# Patient Record
Sex: Female | Born: 1990 | Race: White | Hispanic: No | Marital: Married | Smoking: Former smoker
Health system: Southern US, Community
[De-identification: ages and names within clinical notes are randomized; demographics above are authoritative.]

## PROBLEM LIST (undated history)

## (undated) DIAGNOSIS — Z5189 Encounter for other specified aftercare: Secondary | ICD-10-CM

## (undated) HISTORY — PX: NO PAST SURGERIES: SHX2092

---
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
## 2008-01-14 ENCOUNTER — Emergency Department (HOSPITAL_COMMUNITY): Admission: EM | Admit: 2008-01-14 | Discharge: 2008-01-14 | Payer: Self-pay | Admitting: Emergency Medicine

## 2008-02-18 ENCOUNTER — Emergency Department (HOSPITAL_COMMUNITY): Admission: EM | Admit: 2008-02-18 | Discharge: 2008-02-18 | Payer: Self-pay | Admitting: Emergency Medicine

## 2008-07-10 ENCOUNTER — Emergency Department (HOSPITAL_COMMUNITY): Admission: EM | Admit: 2008-07-10 | Discharge: 2008-07-11 | Payer: Self-pay | Admitting: Emergency Medicine

## 2008-12-14 ENCOUNTER — Emergency Department (HOSPITAL_COMMUNITY): Admission: EM | Admit: 2008-12-14 | Discharge: 2008-12-14 | Payer: Self-pay | Admitting: Emergency Medicine

## 2010-09-01 LAB — URINALYSIS, ROUTINE W REFLEX MICROSCOPIC
Glucose, UA: NEGATIVE mg/dL
Specific Gravity, Urine: 1.019 (ref 1.005–1.030)
Urobilinogen, UA: 0.2 mg/dL (ref 0.0–1.0)
pH: 5.5 (ref 5.0–8.0)

## 2010-09-01 LAB — URINE MICROSCOPIC-ADD ON

## 2010-09-01 LAB — WET PREP, GENITAL
Trich, Wet Prep: NONE SEEN
Yeast Wet Prep HPF POC: NONE SEEN

## 2010-09-01 LAB — POCT PREGNANCY, URINE: Preg Test, Ur: POSITIVE

## 2010-09-01 LAB — HCG, QUANTITATIVE, PREGNANCY: hCG, Beta Chain, Quant, S: 2488 m[IU]/mL — ABNORMAL HIGH (ref <5)

## 2010-10-21 ENCOUNTER — Other Ambulatory Visit (HOSPITAL_COMMUNITY): Payer: Self-pay | Admitting: Obstetrics and Gynecology

## 2010-10-21 DIAGNOSIS — O359XX0 Maternal care for (suspected) fetal abnormality and damage, unspecified, not applicable or unspecified: Secondary | ICD-10-CM

## 2010-10-28 ENCOUNTER — Other Ambulatory Visit (HOSPITAL_COMMUNITY): Payer: Self-pay | Admitting: Obstetrics and Gynecology

## 2010-10-28 ENCOUNTER — Encounter (HOSPITAL_COMMUNITY): Payer: Self-pay

## 2010-10-28 ENCOUNTER — Ambulatory Visit (HOSPITAL_COMMUNITY)
Admission: RE | Admit: 2010-10-28 | Discharge: 2010-10-28 | Disposition: A | Discharge status: Home / Self Care | Payer: 59 | Source: Ambulatory Visit | Attending: Obstetrics and Gynecology | Admitting: Obstetrics and Gynecology

## 2010-10-28 DIAGNOSIS — Q249 Congenital malformation of heart, unspecified: Secondary | ICD-10-CM

## 2010-10-28 DIAGNOSIS — O358XX0 Maternal care for other (suspected) fetal abnormality and damage, not applicable or unspecified: Secondary | ICD-10-CM | POA: Insufficient documentation

## 2010-10-28 DIAGNOSIS — Z1389 Encounter for screening for other disorder: Secondary | ICD-10-CM | POA: Insufficient documentation

## 2010-10-28 DIAGNOSIS — O359XX0 Maternal care for (suspected) fetal abnormality and damage, unspecified, not applicable or unspecified: Secondary | ICD-10-CM

## 2010-10-28 DIAGNOSIS — Z363 Encounter for antenatal screening for malformations: Secondary | ICD-10-CM | POA: Insufficient documentation

## 2010-11-25 ENCOUNTER — Encounter (HOSPITAL_COMMUNITY): Payer: Self-pay

## 2010-11-25 ENCOUNTER — Ambulatory Visit (HOSPITAL_COMMUNITY)
Admission: RE | Admit: 2010-11-25 | Discharge: 2010-11-25 | Disposition: A | Discharge status: Home / Self Care | Payer: 59 | Source: Ambulatory Visit | Attending: Obstetrics and Gynecology | Admitting: Obstetrics and Gynecology

## 2010-11-25 DIAGNOSIS — Q249 Congenital malformation of heart, unspecified: Secondary | ICD-10-CM

## 2010-11-25 DIAGNOSIS — Z3689 Encounter for other specified antenatal screening: Secondary | ICD-10-CM | POA: Insufficient documentation

## 2011-02-09 ENCOUNTER — Encounter (HOSPITAL_COMMUNITY): Payer: Self-pay

## 2011-02-09 ENCOUNTER — Inpatient Hospital Stay (HOSPITAL_COMMUNITY)
Admission: AD | Admit: 2011-02-09 | Discharge: 2011-02-09 | Disposition: A | Discharge status: Home / Self Care | Payer: 59 | Source: Ambulatory Visit | Attending: Obstetrics & Gynecology | Admitting: Obstetrics & Gynecology

## 2011-02-09 DIAGNOSIS — O358XX0 Maternal care for other (suspected) fetal abnormality and damage, not applicable or unspecified: Secondary | ICD-10-CM

## 2011-02-09 DIAGNOSIS — O35BXX0 Maternal care for other (suspected) fetal abnormality and damage, fetal cardiac anomalies, not applicable or unspecified: Secondary | ICD-10-CM

## 2011-02-09 DIAGNOSIS — O4700 False labor before 37 completed weeks of gestation, unspecified trimester: Secondary | ICD-10-CM

## 2011-02-09 DIAGNOSIS — O479 False labor, unspecified: Secondary | ICD-10-CM

## 2011-02-09 HISTORY — DX: Encounter for other specified aftercare: Z51.89

## 2011-02-09 LAB — URINALYSIS, ROUTINE W REFLEX MICROSCOPIC
Bilirubin Urine: NEGATIVE
Hgb urine dipstick: NEGATIVE
Protein, ur: NEGATIVE mg/dL
Urobilinogen, UA: 0.2 mg/dL (ref 0.0–1.0)

## 2011-02-09 NOTE — Progress Notes (Signed)
N/v and contractions today.  Regular contractions but not painful.

## 2011-02-09 NOTE — ED Notes (Signed)
Coming here due to high risk, baby has a cardiac issue

## 2011-02-09 NOTE — ED Provider Notes (Signed)
Brittany Owens is a 20 y.o. female presenting for c/o contractions and nausea. Maternal Medical History:  Reason for admission: Reason for admission: contractions and nausea.  Contractions: Onset was 3-5 hours ago.   Frequency: irregular.   Perceived severity is moderate.   Decreased since arrival  Fetal activity: Perceived fetal activity is normal.   Last perceived fetal movement was within the past hour.    Prenatal complications: No bleeding, infection or preterm labor.   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   Prenatal Complications - Diabetes: none.    OB History    Grav Para Term Preterm Abortions TAB SAB Ect Mult Living   3 1 1  0 1 0 1 0 0 1     Past Medical History  Diagnosis Date  . Blood transfusion 2007    post partum    Past Surgical History  Procedure Date  . No past surgeries    Family History: family history is not on file. Social History:  reports that she has quit smoking. She does not have any smokeless tobacco history on file. She reports that she does not drink alcohol or use illicit drugs.  Review of Systems  Constitutional: Negative for fever.  Gastrointestinal: Positive for heartburn, nausea and vomiting. Negative for abdominal pain.  Genitourinary: Negative for dysuria, urgency, frequency and hematuria.    Dilation: ext os 2 cm, in os closed Station: -3 Exam by:: V Smith CNM Blood pressure 118/70, pulse 71, temperature 97.7 F (36.5 C), temperature source Oral, resp. rate 20, height 5\' 4"  (1.626 m), weight 79.379 kg (175 lb). Maternal Exam:  Uterine Assessment: Contraction strength is moderate.  Contraction frequency is irregular.   Abdomen: Fundal height is S=D.   Fetal presentation: vertex  Introitus: Normal vulva. Vagina is positive for vaginal discharge (thin, white, odorless).  Ferning test: not done.  Nitrazine test: not done. Amniotic fluid character: not assessed.  Pelvis: adequate for delivery.   Cervix:  Cervix evaluated by sterile speculum exam and digital exam.     Fetal Exam Fetal Monitor Review: Mode: ultrasound.   Baseline rate: 130-140.  Variability: moderate (6-25 bpm).   Pattern: accelerations present and no decelerations.    Fetal State Assessment: Category I - tracings are normal.     Physical Exam  Constitutional: She is oriented to person, place, and time. She appears well-developed and well-nourished. No distress.  Cardiovascular: Normal rate.   Respiratory: Effort normal.  GI: Soft. She exhibits no distension. There is no tenderness.  Genitourinary: Uterus is enlarged (gravid). Uterus is not tender. No bleeding around the vagina. Vaginal discharge (thin, white, odorless) found.  Neurological: She is alert and oriented to person, place, and time.  Skin: Skin is warm and dry.  Psychiatric: She has a normal mood and affect.    Prenatal labs: ABO, Rh:   Antibody:   Rubella:   RPR:    HBsAg:    HIV:    GBS:    Results for orders placed during the hospital encounter of 02/09/11 (from the past 24 hour(s))  URINALYSIS, ROUTINE W REFLEX MICROSCOPIC     Status: Normal   Collection Time   02/09/11  3:25 PM      Component Value Range   Color, Urine YELLOW  YELLOW    Appearance CLEAR  CLEAR    Specific Gravity, Urine 1.020  1.005 - 1.030    pH 7.0  5.0 - 8.0    Glucose, UA NEGATIVE  NEGATIVE (mg/dL)  Hgb urine dipstick NEGATIVE  NEGATIVE    Bilirubin Urine NEGATIVE  NEGATIVE    Ketones, ur NEGATIVE  NEGATIVE (mg/dL)   Protein, ur NEGATIVE  NEGATIVE (mg/dL)   Urobilinogen, UA 0.2  0.0 - 1.0 (mg/dL)   Nitrite NEGATIVE  NEGATIVE    Leukocytes, UA NEGATIVE  NEGATIVE   WET PREP, GENITAL     Status: Abnormal   Collection Time   02/09/11  3:45 PM      Component Value Range   Yeast, Wet Prep NONE SEEN  NONE SEEN    Trich, Wet Prep NONE SEEN  NONE SEEN    Clue Cells, Wet Prep NONE SEEN  NONE SEEN    WBC, Wet Prep HPF POC MODERATE (*) NONE SEEN      Assessment/Plan: Assessment: 1. Preterm contractions 2. FHR category I 3. Fetal cardiac defect pt receiving care in [place] w/ plans to deliver at [facility]  Plan: 1. ROI sent for cardiology records and prenatal records to review POC. Cardiology records received and reviewed by Dr. Macon Large and Drs. DaVanzo and Dimaguila (Neonatology) They recommend delivery at [facility] due to availability of best neonatal cardiology care. Dr. Macon Large at [facility] discussing risk with pt and family that delivery at [facility] may delay needed care.  Recommend discussing delivery plans again w/ Obstetrician and Cardiologist. 2. F/U AS w/ Dr. Thamas Jaegers at [facility] 3. Advised of preterm labor precautions, advised to return with increased frequency of contractions for an extended period of time. 4. FKCs 5. Increase fluids and rest  [place], [place] 02/09/2011, 4:00 PM

## 2011-02-15 LAB — PREGNANCY, URINE: Preg Test, Ur: NEGATIVE

## 2011-03-01 LAB — DIFFERENTIAL
Basophils Relative: 4 — ABNORMAL HIGH
Eosinophils Absolute: 0.1
Eosinophils Relative: 0
Lymphs Abs: 1.5
Monocytes Relative: 5

## 2011-03-01 LAB — URINALYSIS, ROUTINE W REFLEX MICROSCOPIC
Bilirubin Urine: NEGATIVE
Glucose, UA: NEGATIVE
Hgb urine dipstick: NEGATIVE
Ketones, ur: NEGATIVE
Nitrite: NEGATIVE
Protein, ur: NEGATIVE
Specific Gravity, Urine: 1.024
Urobilinogen, UA: 1
pH: 5.5

## 2011-03-01 LAB — COMPREHENSIVE METABOLIC PANEL
ALT: 8
AST: 15
Alkaline Phosphatase: 76
CO2: 24
Calcium: 9
GFR calc Af Amer: >60
Potassium: 3.4 — ABNORMAL LOW
Sodium: 134 — ABNORMAL LOW
Total Protein: 6.7

## 2011-03-01 LAB — GC/CHLAMYDIA PROBE AMP, GENITAL
Chlamydia, DNA Probe: NEGATIVE
GC Probe Amp, Genital: NEGATIVE

## 2011-03-01 LAB — CBC
Hemoglobin: 13.5
MCHC: 34.4
RBC: 4.72
RDW: 11.6

## 2011-03-01 LAB — PREGNANCY, URINE: Preg Test, Ur: NEGATIVE

## 2011-03-01 LAB — URINE MICROSCOPIC-ADD ON

## 2011-03-01 LAB — WET PREP, GENITAL: Trich, Wet Prep: NONE SEEN

## 2011-03-01 LAB — RPR: RPR Ser Ql: NONREACTIVE

## 2011-07-01 ENCOUNTER — Encounter (HOSPITAL_COMMUNITY): Payer: Self-pay

## 2014-03-18 ENCOUNTER — Encounter (HOSPITAL_COMMUNITY): Payer: Self-pay

## 2015-06-24 ENCOUNTER — Other Ambulatory Visit (HOSPITAL_COMMUNITY): Payer: Self-pay | Admitting: Specialist

## 2015-06-24 DIAGNOSIS — Z8774 Personal history of (corrected) congenital malformations of heart and circulatory system: Secondary | ICD-10-CM

## 2015-06-30 ENCOUNTER — Encounter (HOSPITAL_COMMUNITY): Payer: Self-pay

## 2015-06-30 ENCOUNTER — Ambulatory Visit (HOSPITAL_COMMUNITY)
Admission: RE | Admit: 2015-06-30 | Discharge: 2015-06-30 | Disposition: A | Discharge status: Home / Self Care | Payer: Medicaid Other | Source: Ambulatory Visit | Attending: Specialist | Admitting: Specialist

## 2015-06-30 DIAGNOSIS — O09292 Supervision of pregnancy with other poor reproductive or obstetric history, second trimester: Secondary | ICD-10-CM | POA: Insufficient documentation

## 2015-06-30 DIAGNOSIS — Z8774 Personal history of (corrected) congenital malformations of heart and circulatory system: Secondary | ICD-10-CM

## 2015-06-30 DIAGNOSIS — Z3A19 19 weeks gestation of pregnancy: Secondary | ICD-10-CM | POA: Diagnosis not present

## 2015-06-30 DIAGNOSIS — Z36 Encounter for antenatal screening of mother: Secondary | ICD-10-CM | POA: Diagnosis not present

## 2015-06-30 DIAGNOSIS — O352XX0 Maternal care for (suspected) hereditary disease in fetus, not applicable or unspecified: Secondary | ICD-10-CM | POA: Diagnosis not present

## 2015-09-09 ENCOUNTER — Other Ambulatory Visit (HOSPITAL_COMMUNITY): Payer: Self-pay | Admitting: Obstetrics and Gynecology

## 2015-09-09 DIAGNOSIS — Z3483 Encounter for supervision of other normal pregnancy, third trimester: Secondary | ICD-10-CM

## 2015-09-24 ENCOUNTER — Encounter (HOSPITAL_COMMUNITY): Payer: Self-pay

## 2015-09-24 ENCOUNTER — Ambulatory Visit (HOSPITAL_COMMUNITY)
Admission: RE | Admit: 2015-09-24 | Discharge: 2015-09-24 | Disposition: A | Discharge status: Home / Self Care | Payer: Medicaid Other | Source: Ambulatory Visit | Attending: Specialist | Admitting: Specialist

## 2015-09-24 VITALS — BP 115/59 | HR 89 | Wt 180.6 lb

## 2015-09-24 DIAGNOSIS — O352XX0 Maternal care for (suspected) hereditary disease in fetus, not applicable or unspecified: Secondary | ICD-10-CM | POA: Diagnosis present

## 2015-09-24 DIAGNOSIS — Z3A31 31 weeks gestation of pregnancy: Secondary | ICD-10-CM | POA: Insufficient documentation

## 2015-09-24 DIAGNOSIS — O403XX0 Polyhydramnios, third trimester, not applicable or unspecified: Secondary | ICD-10-CM | POA: Insufficient documentation

## 2015-09-24 DIAGNOSIS — O09299 Supervision of pregnancy with other poor reproductive or obstetric history, unspecified trimester: Secondary | ICD-10-CM | POA: Diagnosis present

## 2015-09-24 DIAGNOSIS — Z3483 Encounter for supervision of other normal pregnancy, third trimester: Secondary | ICD-10-CM

## 2015-10-22 ENCOUNTER — Other Ambulatory Visit (HOSPITAL_COMMUNITY): Payer: Self-pay | Admitting: Maternal and Fetal Medicine

## 2015-10-22 ENCOUNTER — Encounter (HOSPITAL_COMMUNITY): Payer: Self-pay

## 2015-10-22 ENCOUNTER — Ambulatory Visit (HOSPITAL_COMMUNITY)
Admission: RE | Admit: 2015-10-22 | Discharge: 2015-10-22 | Disposition: A | Discharge status: Home / Self Care | Payer: Medicaid Other | Source: Ambulatory Visit | Attending: Specialist | Admitting: Specialist

## 2015-10-22 DIAGNOSIS — Z3A35 35 weeks gestation of pregnancy: Secondary | ICD-10-CM

## 2015-10-22 DIAGNOSIS — O352XX5 Maternal care for (suspected) hereditary disease in fetus, fetus 5: Secondary | ICD-10-CM

## 2015-10-22 DIAGNOSIS — O403XX0 Polyhydramnios, third trimester, not applicable or unspecified: Secondary | ICD-10-CM

## 2015-10-22 DIAGNOSIS — O09293 Supervision of pregnancy with other poor reproductive or obstetric history, third trimester: Secondary | ICD-10-CM | POA: Diagnosis not present

## 2015-10-22 DIAGNOSIS — O352XX0 Maternal care for (suspected) hereditary disease in fetus, not applicable or unspecified: Secondary | ICD-10-CM | POA: Insufficient documentation

## 2015-10-22 NOTE — ED Notes (Signed)
Pt reports one episode of wet panties yesterday morning, no leaking since.

## 2016-05-04 ENCOUNTER — Encounter (HOSPITAL_COMMUNITY): Payer: Self-pay

## 2017-02-23 ENCOUNTER — Other Ambulatory Visit (HOSPITAL_COMMUNITY): Payer: Self-pay | Admitting: Obstetrics and Gynecology

## 2017-02-23 ENCOUNTER — Encounter (HOSPITAL_COMMUNITY): Payer: Self-pay

## 2017-02-23 DIAGNOSIS — Z3689 Encounter for other specified antenatal screening: Secondary | ICD-10-CM

## 2017-02-23 DIAGNOSIS — Z8279 Family history of other congenital malformations, deformations and chromosomal abnormalities: Secondary | ICD-10-CM

## 2017-02-23 DIAGNOSIS — Z3A19 19 weeks gestation of pregnancy: Secondary | ICD-10-CM

## 2017-02-25 ENCOUNTER — Encounter (HOSPITAL_COMMUNITY): Payer: Self-pay

## 2017-02-25 ENCOUNTER — Ambulatory Visit (HOSPITAL_COMMUNITY)
Admission: RE | Admit: 2017-02-25 | Discharge: 2017-02-25 | Disposition: A | Discharge status: Home / Self Care | Payer: Medicaid Other | Source: Ambulatory Visit | Attending: Obstetrics and Gynecology | Admitting: Obstetrics and Gynecology

## 2017-03-10 ENCOUNTER — Encounter (HOSPITAL_COMMUNITY): Payer: Self-pay

## 2017-03-10 ENCOUNTER — Ambulatory Visit (HOSPITAL_COMMUNITY)
Admission: RE | Admit: 2017-03-10 | Discharge: 2017-03-10 | Disposition: A | Discharge status: Home / Self Care | Payer: Medicaid Other | Source: Ambulatory Visit | Attending: Obstetrics and Gynecology | Admitting: Obstetrics and Gynecology

## 2017-03-10 DIAGNOSIS — Z3A19 19 weeks gestation of pregnancy: Secondary | ICD-10-CM

## 2017-03-10 DIAGNOSIS — Z3A20 20 weeks gestation of pregnancy: Secondary | ICD-10-CM | POA: Insufficient documentation

## 2017-03-10 DIAGNOSIS — Z3689 Encounter for other specified antenatal screening: Secondary | ICD-10-CM | POA: Diagnosis not present

## 2017-03-10 DIAGNOSIS — Z8279 Family history of other congenital malformations, deformations and chromosomal abnormalities: Secondary | ICD-10-CM | POA: Diagnosis not present

## 2017-03-10 DIAGNOSIS — O09299 Supervision of pregnancy with other poor reproductive or obstetric history, unspecified trimester: Secondary | ICD-10-CM | POA: Insufficient documentation

## 2017-03-10 NOTE — Addendum Note (Signed)
Encounter addended by: Genevie CheshireWaken, Agustina Witzke M, RT on: 03/10/2017 11:15 AM<BR>    Actions taken: Imaging Exam ended

## 2017-04-12 ENCOUNTER — Encounter (HOSPITAL_COMMUNITY): Payer: Self-pay

## 2017-04-20 IMAGING — US US MFM OB DETAIL+14 WK
1 series · 14 of 28 positions shown · non-contrast
Comparison: none

[Series 1: us mfm ob detail+14 wk · 121 acquisitions, 14 frames shown]
[im 5/121]
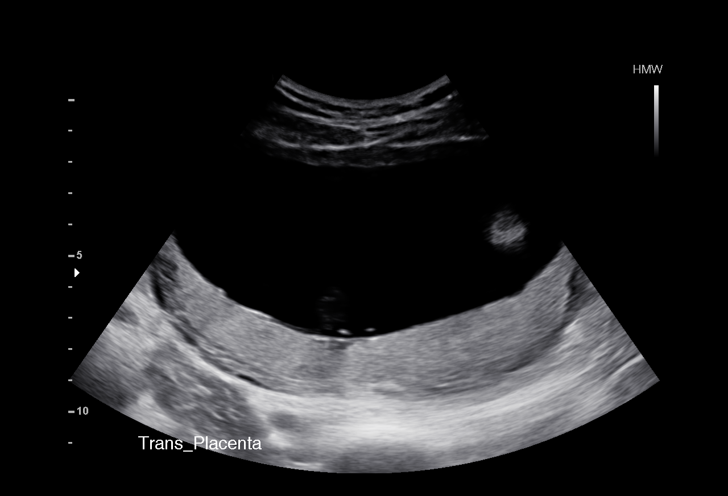
[im 14/121]
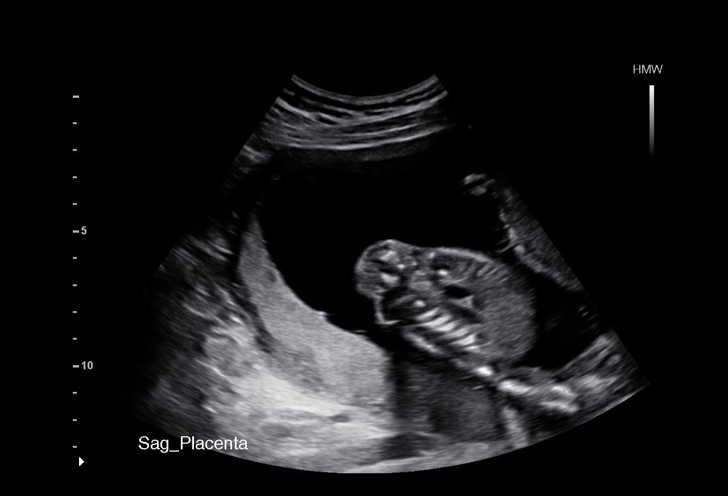
[im 23/121]
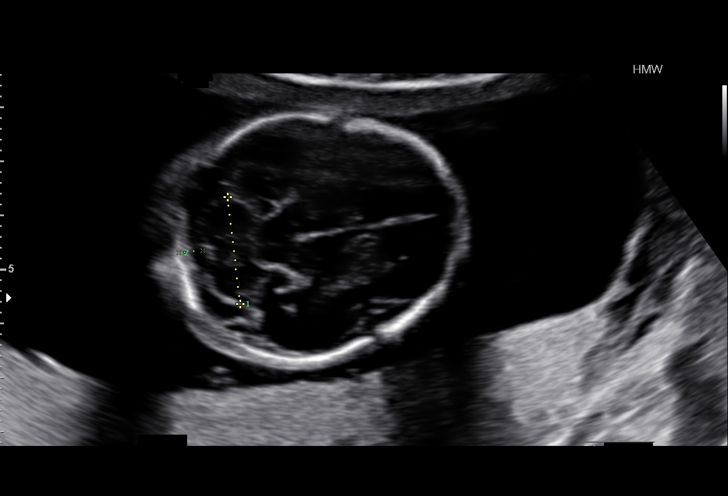
[im 32/121]
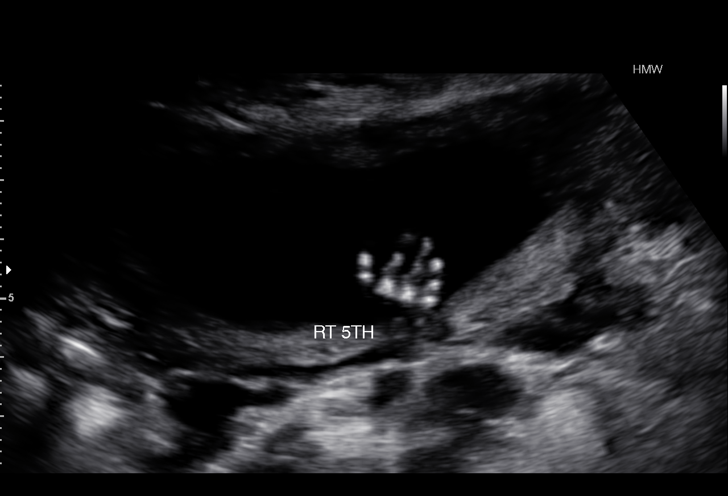
[im 41/121]
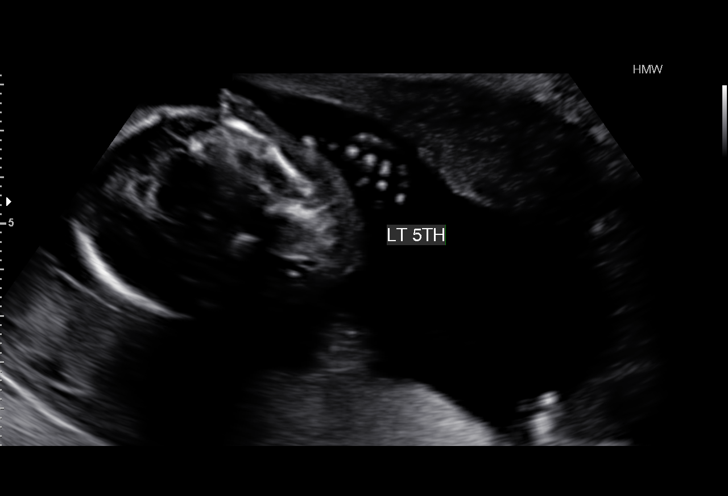
[im 49/121]
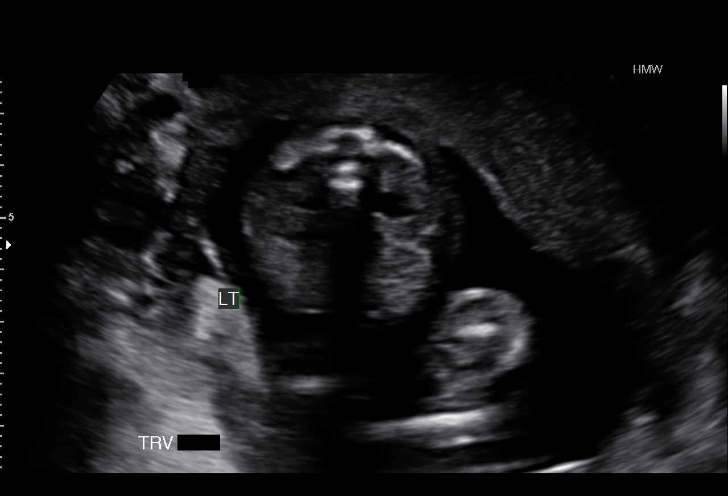
[im 58/121]
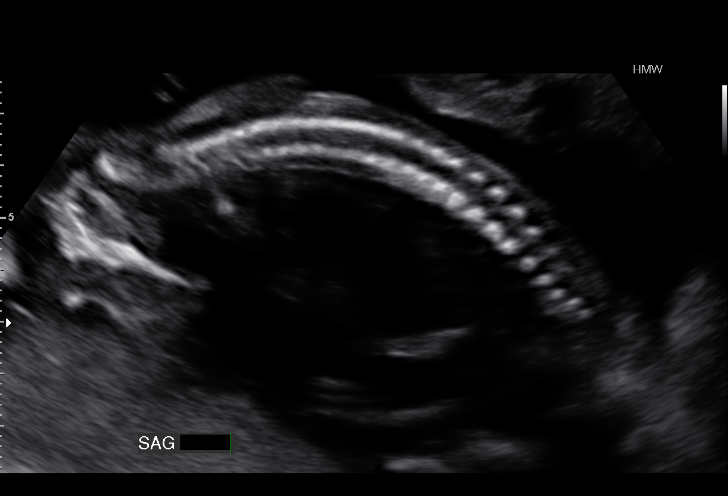
[im 67/121]
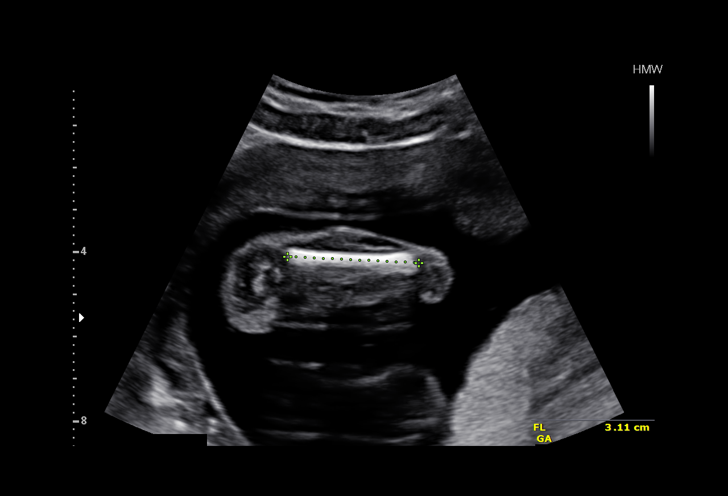
[im 76/121]
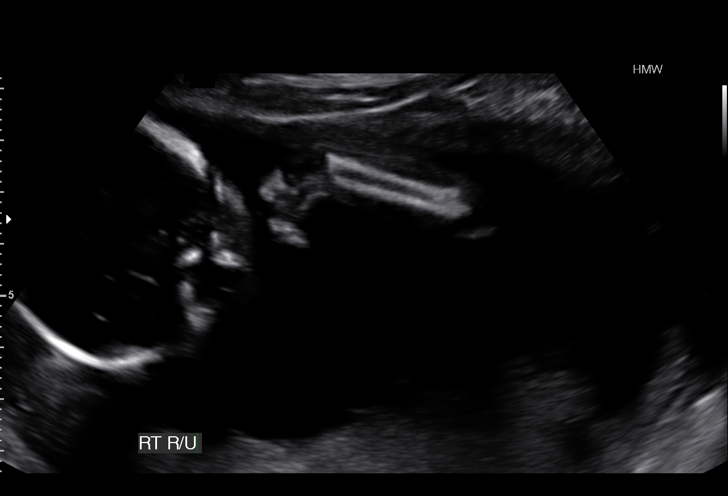
[im 85/121]
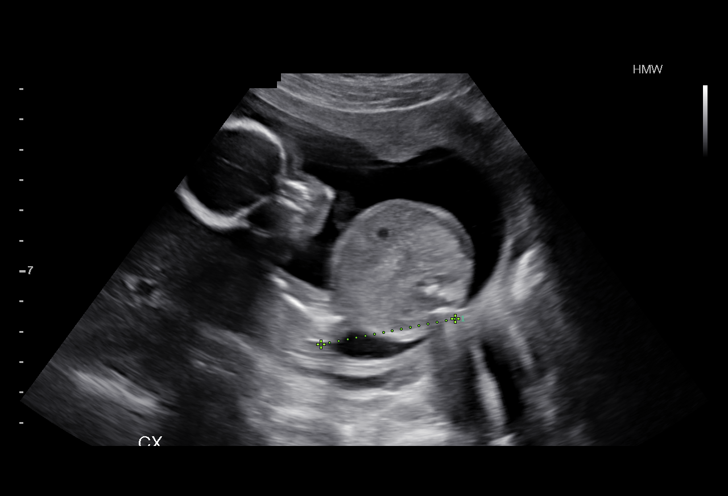
[im 94/121]
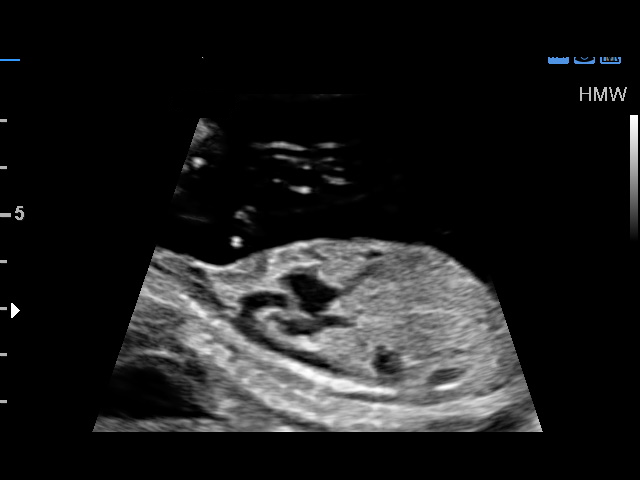
[im 103/121]
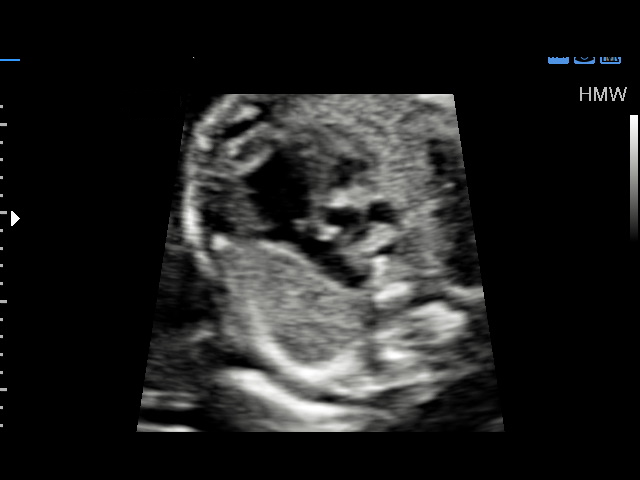
[im 112/121]
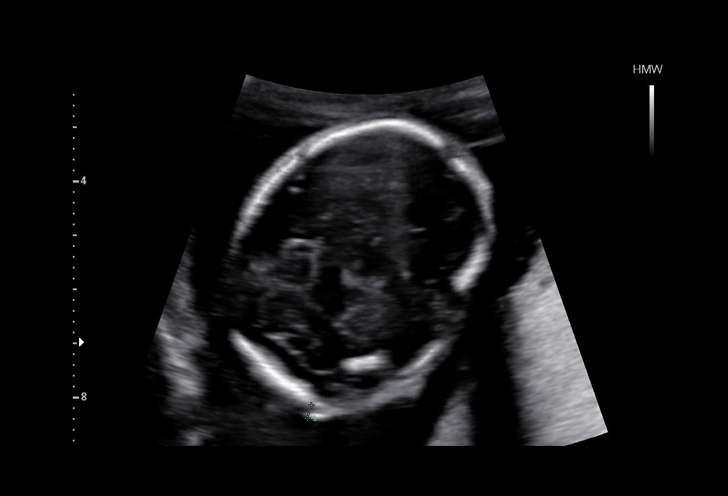
[im 121/121]
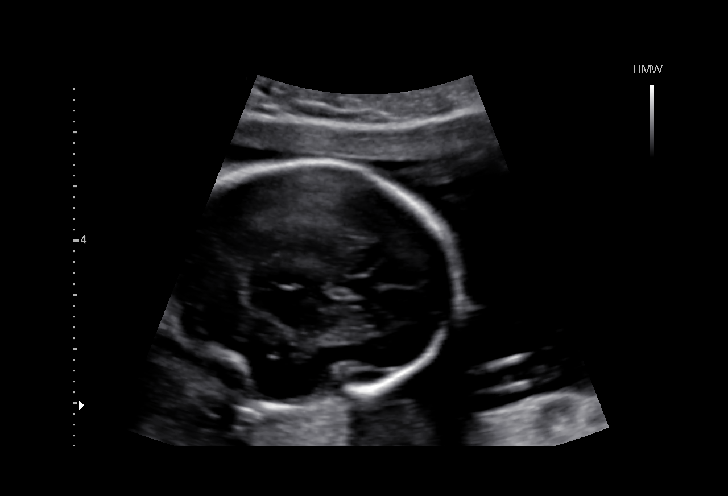

[14 of 28 positions shown; findings below may reference images not displayed]

pm)

Date:

[HOSPITAL][HOSPITAL]

1  LIBIA PIZANO               [id number]        [id number]     [id number]
Indications

19 weeks gestation of pregnancy
Poor obstetrical history (previous child with
vascular ring)
Detailed fetal anatomic survey                  Z36
Maternal care for (suspected) hereditary
disease in fetus, not applicable or
unspecified
OB History

Height:        5'4"   Weight:   175        BMI:
Gravidity:     4         Term:  2         SAB:    1
Living:        2
Fetal Evaluation

Num Of Fetuses:      1
Fetal Heart          151
Rate(bpm):
Cardiac Activity:    Observed
Presentation:        Breech
Placenta:            Posterior, above cervical os
P. Cord Insertion:   Visualized, central

Amniotic Fluid
AFI FV:      Subjectively within normal limits
Larg Pckt:      5.5  cm
Biometry

BPD:      44.2  mm     G. Age:   19w 3d                  CI:        70.81   %    70 - 86
FL/HC:      18.9   %    16.1 -
HC:      167.4  mm     G. Age:   19w 3d        62   %    HC/AC:      1.13        1.09 -
AC:      148.4  mm     G. Age:   20w 1d        80   %    FL/BPD      71.7   %
:
FL:       31.7  mm     G. Age:   19w 6d        74   %    FL/AC:      21.4   %    20 - 24
HUM:      29.1  mm     G. Age:   19w 3d        62   %

Est.         321   gm   0 lb 11 oz      59   %
FW:
Gestational Age

LMP:           19w 0d        Date:  02/17/15                  EDD:   11/24/15
U/S Today:     19w 5d                                         EDD:   11/19/15
Best:          19w 0d    Det. By:   LMP  (02/17/15)           EDD:   11/24/15
Anatomy

Cranium:          Appears normal         Aortic Arch:       Appears normal
Fetal Cavum:      Appears normal         Ductal Arch:       Appears normal
Ventricles:       Appears normal         Diaphragm:         Previously seen
Choroid Plexus:   Appears normal         Stomach:           Appears normal,
left sided
Cerebellum:       Appears normal         Abdomen:           Appears normal
Posterior         Appears normal         Abdominal          Appears nml (cord
Fossa:                                   Wall:              insert, abd wall)
Nuchal Fold:      Appears normal         Cord Vessels:      Appears normal (3
vessel cord)
Face:             Appears normal         Kidneys:           Bilat pyelectasis,
(orbits and profile)                      Rt 5mm, Lt 4 mm
Lips:             Appears normal         Bladder:           Appears normal
Fetal Thoracic:   Appears normal         Spine:             Appears normal
Heart:            Appears normal         Upper              Appears normal
(4CH, axis, and        Extremities:
situs)
RVOT:             Appears normal         Lower              Appears normal
Extremities:
LVOT:             Appears normal

Other:   Fetus appears to be a female. Heels and 5th digit visualized.
Technically difficult due to fetal position.
Cervix Uterus Adnexa

Cervix
Length:             3.2  cm.
Normal appearance by transabdominal scan.

Uterus
No abnormality visualized.

Left Ovary
Not visualized.
Right Ovary
Not visualized.

Cul De        No free fluid seen.
Sac:

Adnexa:       No abnormality visualized.
Impression

SIUP at 19+0 weeks
Normal detailed fetal anatomy; heart views normal with left-
sided arch; bilateral UTD A1 (mild pyelectasis)
Markers of aneuploidy: none
Normal amniotic fluid volume
Measurements consistent with LMP dating
Recommendations

Fetal ECHO scheduled
Please refer back between 28 and 30 weeks to reassess
kidneys

## 2017-10-14 ENCOUNTER — Encounter (HOSPITAL_COMMUNITY): Payer: Self-pay

## 2018-12-30 IMAGING — US US MFM OB DETAIL+14 WK
1 series · 14 of 28 positions shown · non-contrast
Comparison: none

[Series 1: us mfm ob detail+14 wk · 14 of 73 slices shown]
[im 3/73]
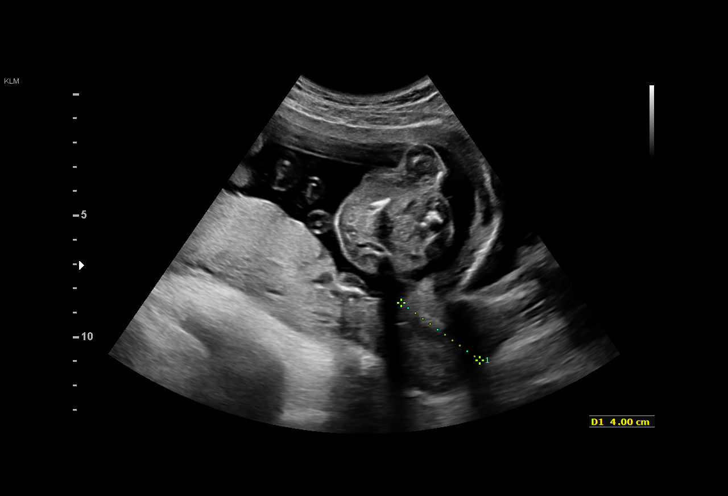
[im 9/73]
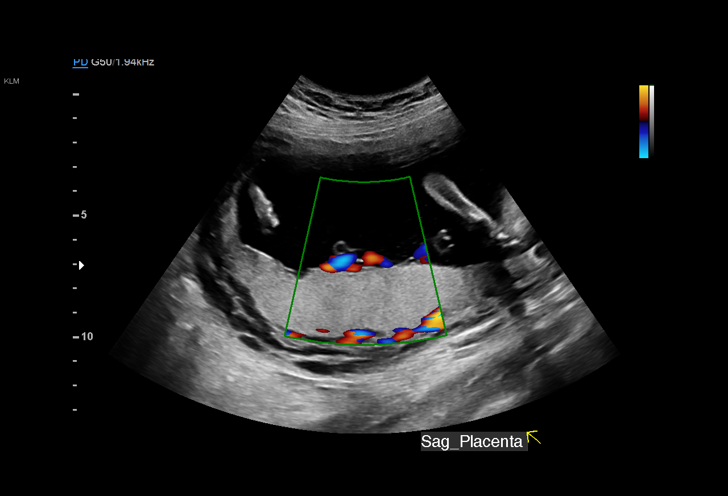
[im 14/73]
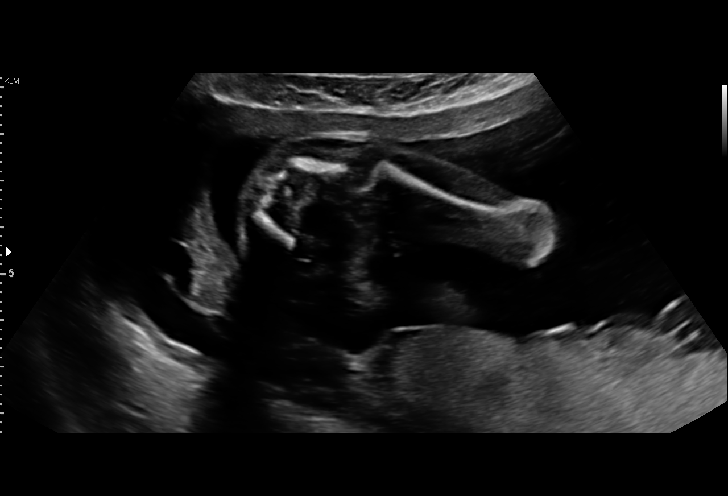
[im 19/73]
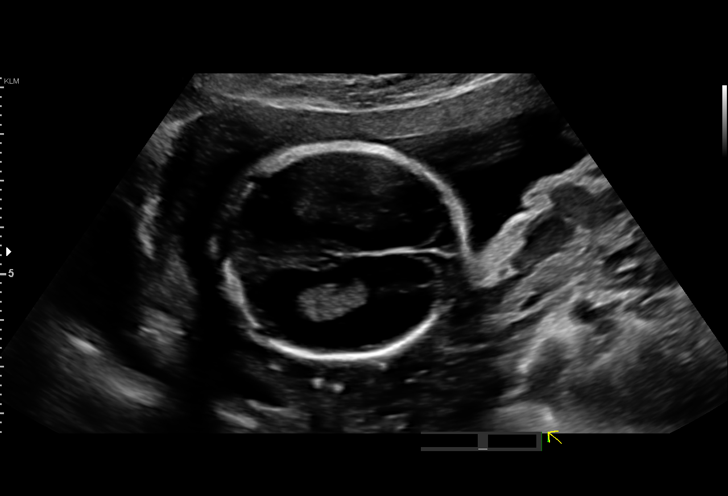
[im 25/73]
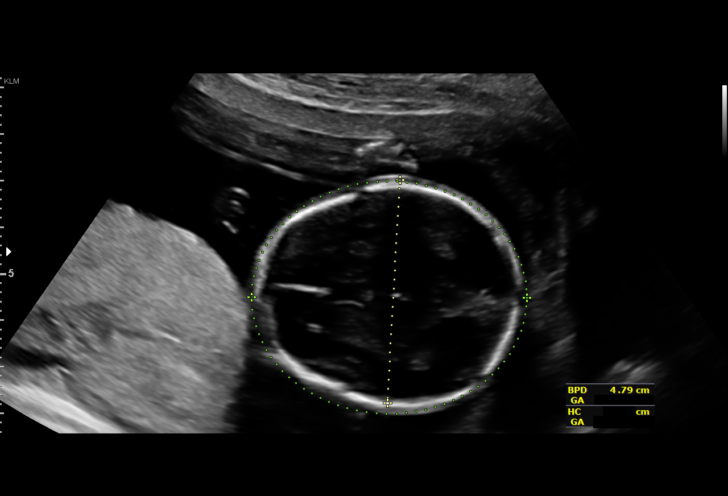
[im 30/73]
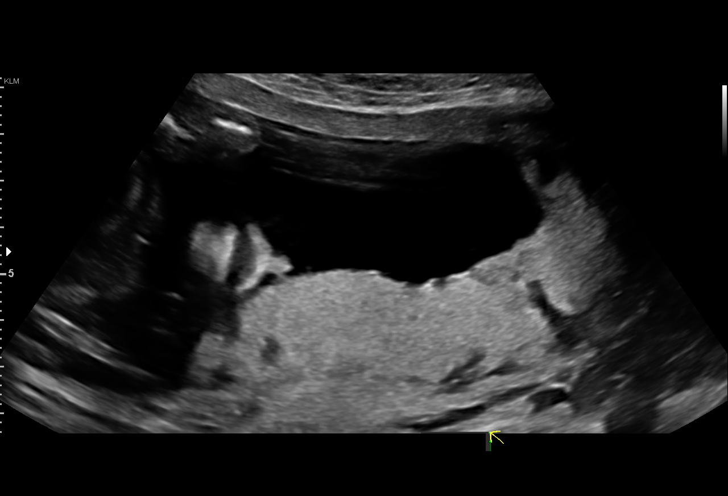
[im 35/73]
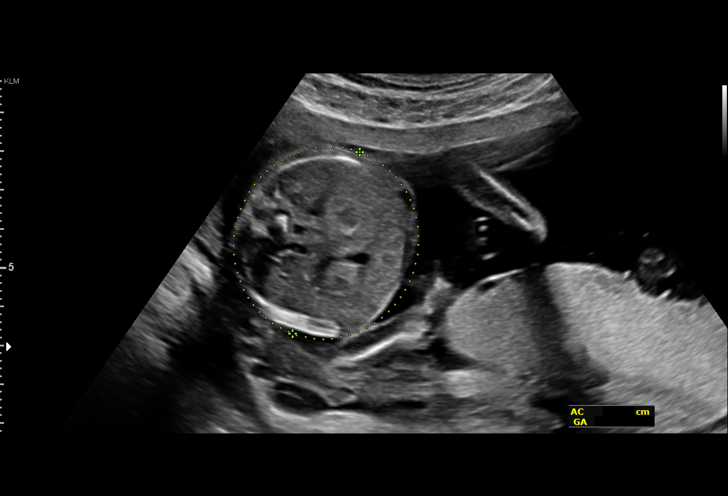
[im 41/73]
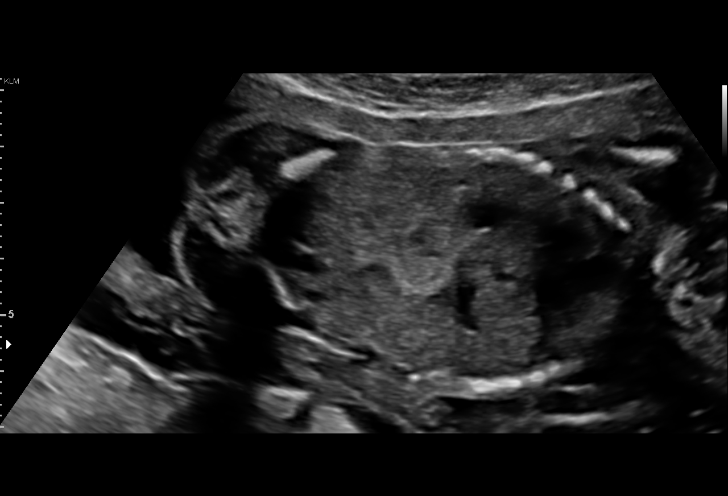
[im 46/73]
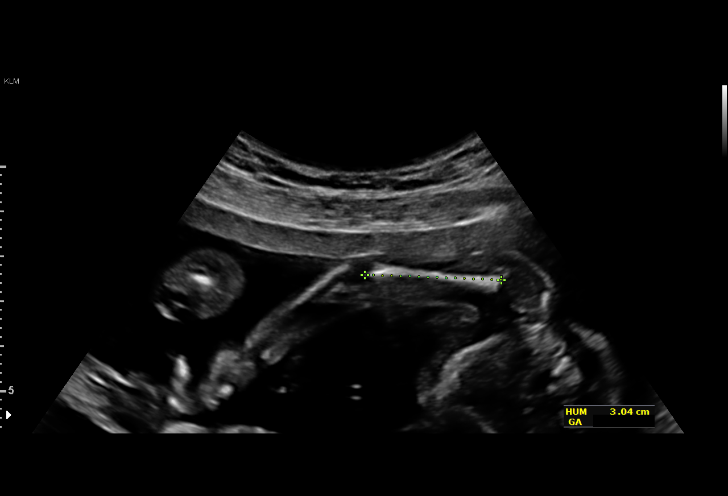
[im 51/73]
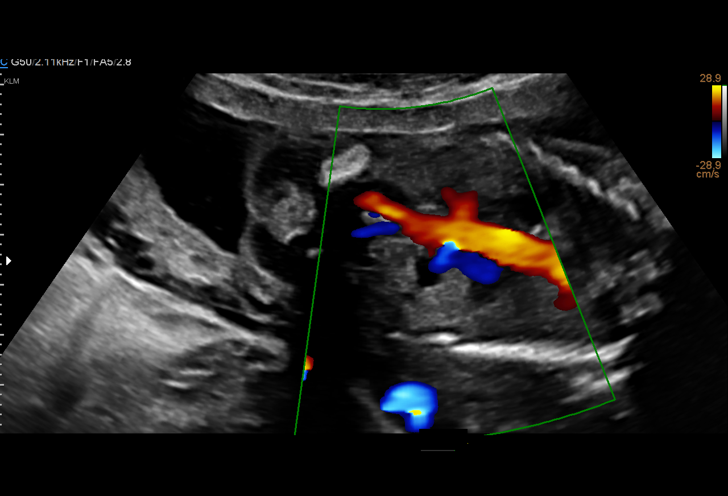
[im 57/73]
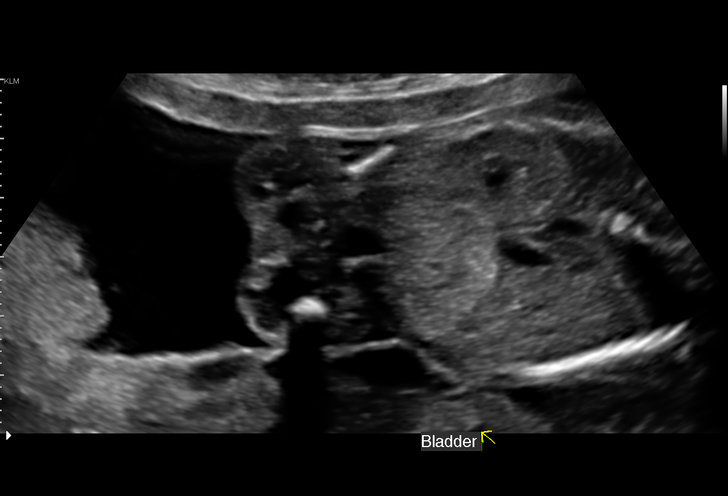
[im 62/73]
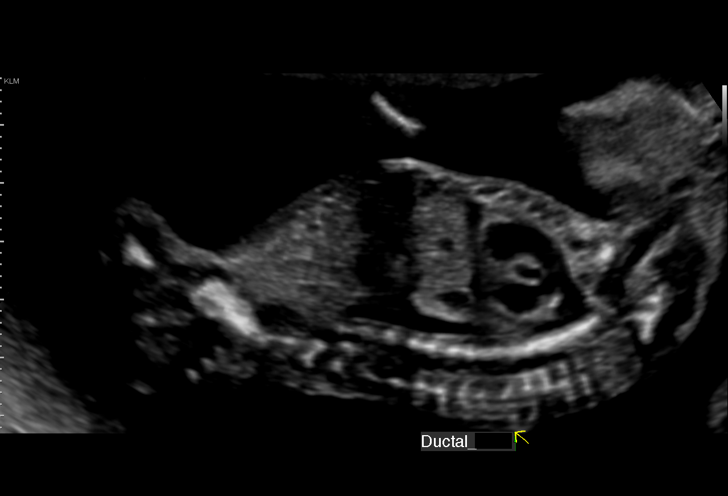
[im 67/73]
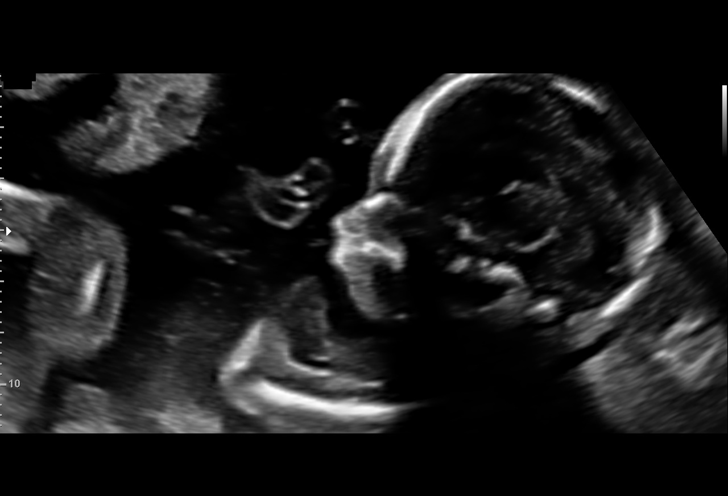
[im 73/73]
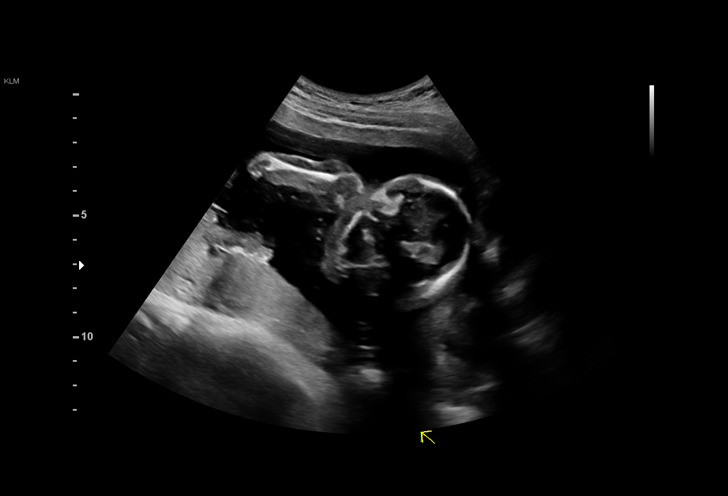

[14 of 28 positions shown; findings below may reference images not displayed]

1  MISGHINA GAJADHAR           [id number]       [id number]     [id number]
Indications

20 weeks gestation of pregnancy
Previous pregnacy with congenital heart
(cardiac) defect (vascular ring)
Encounter for fetal anatomic survey
OB History

Gravidity:    6         Term:   3        Prem:   0        SAB:   2
TOP:          0       Ectopic:  0        Living: 3
Fetal Evaluation

Num Of Fetuses:     1
Fetal Heart         167
Rate(bpm):
Cardiac Activity:   Observed
Presentation:       Cephalic
Placenta:           Posterior, above cervical os
P. Cord Insertion:  Visualized

Amniotic Fluid
AFI FV:      Subjectively within normal limits
Biometry

BPD:        48  mm     G. Age:  20w 4d         35  %    CI:        77.98   %    70 - 86
FL/HC:      18.0   %    15.9 -
HC:       172   mm     G. Age:  19w 6d          7  %    HC/AC:      1.13        1.06 -
AC:      152.3  mm     G. Age:  20w 3d         30  %    FL/BPD:     64.6   %
FL:         31  mm     G. Age:  19w 4d          9  %    FL/AC:      20.4   %    20 - 24
HUM:      30.4  mm     G. Age:  20w 0d         26  %

Est. FW:     329  gm    0 lb 12 oz      28  %
Gestational Age

LMP:           20w 6d        Date:  10/15/16                 EDD:   07/22/17
U/S Today:     20w 1d                                        EDD:   07/27/17
Best:          20w 6d     Det. By:  LMP  (10/15/16)          EDD:   07/22/17
Anatomy

Cranium:               Appears normal         Aortic Arch:            Appears normal
Cavum:                 Appears normal         Ductal Arch:            Appears normal
Ventricles:            Appears normal         Diaphragm:              Appears normal
Choroid Plexus:        Appears normal         Stomach:                Appears normal, left
sided
Cerebellum:            Appears normal         Abdomen:                Appears normal
Posterior Fossa:       Appears normal         Abdominal Wall:         Appears nml (cord
insert, abd wall)
Nuchal Fold:           Appears normal         Cord Vessels:           Appears normal (3
vessel cord)
Face:                  Appears normal         Kidneys:                Appear normal
(orbits and profile)
Lips:                  Appears normal         Bladder:                Appears normal
Thoracic:              Appears normal         Spine:                  Appears normal
Heart:                 Appears normal         Upper Extremities:      Appears normal
(4CH, axis, and situs
RVOT:                  Appears normal         Lower Extremities:      Appears normal
LVOT:                  Appears normal

Other:  Fetus appears to be a male. Heels and 5th digit visualized.
Cervix Uterus Adnexa

Cervix
Length:              4  cm.
Normal appearance by transabdominal scan.
Impression

Singleton intrauterine pregnancy at 20+6 weeks with a
previous child wioth cardiac anomaly
Review of the anatomy shows no sonographic markers for
aneuploidy or structural anomalies
Amniotic fluid volume is normal
Estimated fetal weight is 329g which is growth in the 28th
percentile
Recommendations

The patient's child has a right aortic atch vascular ring with
aberrant left subclavian, and is followed by [facility] here in [HOSPITAL],
They did a fetal echocardiogram on her last child in [id number].
that child was not affected with any anomaly.
For continuity purposes we will refer her to them for a repeat
fetal echocardiogram.
No other MFM appointments have been made, but a repeat
scan for growth in  6-8 weeks due to low growth percentile
